# Patient Record
Sex: Female | Born: 1955 | Race: Black or African American | Hispanic: No | Marital: Married | Smoking: Never smoker
Health system: Southern US, Community
[De-identification: ages and names within clinical notes are randomized; demographics above are authoritative.]

## PROBLEM LIST (undated history)

## (undated) ENCOUNTER — Emergency Department (HOSPITAL_COMMUNITY): Admission: EM | Payer: Self-pay | Source: Home / Self Care

## (undated) DIAGNOSIS — M549 Dorsalgia, unspecified: Secondary | ICD-10-CM

## (undated) DIAGNOSIS — M1990 Unspecified osteoarthritis, unspecified site: Secondary | ICD-10-CM

## (undated) HISTORY — PX: ABDOMINAL HYSTERECTOMY: SHX81

## (undated) HISTORY — PX: TONSILLECTOMY: SUR1361

---

## 2003-07-08 ENCOUNTER — Emergency Department (HOSPITAL_COMMUNITY): Admission: EM | Admit: 2003-07-08 | Discharge: 2003-07-09 | Payer: Self-pay | Admitting: Emergency Medicine

## 2005-10-14 ENCOUNTER — Ambulatory Visit (HOSPITAL_COMMUNITY): Admission: RE | Admit: 2005-10-14 | Discharge: 2005-10-14 | Payer: Self-pay | Admitting: Obstetrics & Gynecology

## 2014-04-03 ENCOUNTER — Emergency Department (HOSPITAL_COMMUNITY)
Admission: EM | Admit: 2014-04-03 | Discharge: 2014-04-03 | Disposition: A | Discharge status: Home / Self Care | Payer: BC Managed Care – PPO | Attending: Emergency Medicine | Admitting: Emergency Medicine

## 2014-04-03 ENCOUNTER — Emergency Department (HOSPITAL_COMMUNITY): Payer: BC Managed Care – PPO

## 2014-04-03 ENCOUNTER — Encounter (HOSPITAL_COMMUNITY): Payer: Self-pay | Admitting: Emergency Medicine

## 2014-04-03 DIAGNOSIS — Y92410 Unspecified street and highway as the place of occurrence of the external cause: Secondary | ICD-10-CM | POA: Diagnosis not present

## 2014-04-03 DIAGNOSIS — S161XXA Strain of muscle, fascia and tendon at neck level, initial encounter: Secondary | ICD-10-CM

## 2014-04-03 DIAGNOSIS — E669 Obesity, unspecified: Secondary | ICD-10-CM | POA: Insufficient documentation

## 2014-04-03 DIAGNOSIS — S39012A Strain of muscle, fascia and tendon of lower back, initial encounter: Secondary | ICD-10-CM | POA: Diagnosis not present

## 2014-04-03 DIAGNOSIS — S0990XA Unspecified injury of head, initial encounter: Secondary | ICD-10-CM | POA: Insufficient documentation

## 2014-04-03 DIAGNOSIS — S199XXA Unspecified injury of neck, initial encounter: Secondary | ICD-10-CM | POA: Diagnosis present

## 2014-04-03 DIAGNOSIS — Y9389 Activity, other specified: Secondary | ICD-10-CM | POA: Diagnosis not present

## 2014-04-03 LAB — I-STAT CHEM 8, ED
BUN: 14 mg/dL (ref 6–23)
CALCIUM ION: 1.15 mmol/L (ref 1.13–1.30)
CHLORIDE: 109 meq/L (ref 96–112)
CREATININE: 0.8 mg/dL (ref 0.50–1.10)
GLUCOSE: 87 mg/dL (ref 70–99)
HEMATOCRIT: 42 % (ref 36.0–46.0)
HEMOGLOBIN: 14.3 g/dL (ref 12.0–15.0)
POTASSIUM: 3.6 meq/L — AB (ref 3.7–5.3)
Sodium: 143 mEq/L (ref 137–147)
TCO2: 22 mmol/L (ref 0–100)

## 2014-04-03 MED ORDER — ACETAMINOPHEN 500 MG PO TABS: 1000.0000 mg | ORAL_TABLET | Freq: Once | ORAL | Status: DC

## 2014-04-03 MED ORDER — SODIUM CHLORIDE 0.9 % IV BOLUS (SEPSIS)
1000.0000 mL | Freq: Once | INTRAVENOUS | Status: AC
  Administered 2014-04-03: 1000 mL via INTRAVENOUS

## 2014-04-03 MED ORDER — IOHEXOL 350 MG/ML SOLN
80.0000 mL | Freq: Once | INTRAVENOUS | Status: AC | PRN
  Administered 2014-04-03: 80 mL via INTRAVENOUS

## 2014-04-03 MED ORDER — MORPHINE SULFATE 4 MG/ML IJ SOLN
4.0000 mg | Freq: Once | INTRAMUSCULAR | Status: AC
  Administered 2014-04-03: 4 mg via INTRAVENOUS
  Filled 2014-04-03: qty 1

## 2014-04-03 MED ORDER — OXYCODONE-ACETAMINOPHEN 5-325 MG PO TABS: 1.0000 | ORAL_TABLET | Freq: Four times a day (QID) | ORAL | Status: DC | PRN
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
## 2014-04-03 NOTE — ED Notes (Addendum)
error 

## 2014-04-03 NOTE — ED Notes (Signed)
Pt refuses wheelchair, ambulatory with no apparent difficulty

## 2014-04-03 NOTE — ED Notes (Signed)
Patient returned from CT & X-ray, Monitor reapplied.
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
## 2014-04-03 NOTE — ED Notes (Signed)
Patient transported to X-ray 

## 2014-04-04 NOTE — Discharge Planning (Signed)
[facility] Community Health & Eligibility Specialist Spoke to patient regarding follow up care and establishing care with a provider. Resource guide and my contact information provided for any future questions or concerns. Pt states she has insurance at this time. No other Community Health & Eligibility Specialist needs identified at this time.

## 2014-09-25 ENCOUNTER — Other Ambulatory Visit: Payer: Self-pay | Admitting: Internal Medicine

## 2014-09-25 DIAGNOSIS — Z1231 Encounter for screening mammogram for malignant neoplasm of breast: Secondary | ICD-10-CM

## 2014-10-01 ENCOUNTER — Other Ambulatory Visit: Payer: Self-pay | Admitting: Internal Medicine

## 2014-10-01 ENCOUNTER — Ambulatory Visit
Admission: RE | Admit: 2014-10-01 | Discharge: 2014-10-01 | Disposition: A | Discharge status: Home / Self Care | Payer: 59 | Source: Ambulatory Visit | Attending: Internal Medicine | Admitting: Internal Medicine

## 2014-10-01 DIAGNOSIS — Z1231 Encounter for screening mammogram for malignant neoplasm of breast: Secondary | ICD-10-CM

## 2014-10-01 DIAGNOSIS — N6452 Nipple discharge: Secondary | ICD-10-CM

## 2014-10-07 ENCOUNTER — Other Ambulatory Visit: Payer: Self-pay | Admitting: Internal Medicine

## 2014-10-07 DIAGNOSIS — N6452 Nipple discharge: Secondary | ICD-10-CM

## 2014-10-23 ENCOUNTER — Ambulatory Visit
Admission: RE | Admit: 2014-10-23 | Discharge: 2014-10-23 | Disposition: A | Discharge status: Home / Self Care | Payer: 59 | Source: Ambulatory Visit | Attending: Internal Medicine | Admitting: Internal Medicine

## 2014-10-23 DIAGNOSIS — N6452 Nipple discharge: Secondary | ICD-10-CM

## 2014-11-20 ENCOUNTER — Other Ambulatory Visit: Payer: Self-pay | Admitting: Gastroenterology

## 2015-01-31 ENCOUNTER — Encounter (HOSPITAL_COMMUNITY): Payer: Self-pay | Admitting: *Deleted

## 2015-02-17 ENCOUNTER — Encounter (HOSPITAL_COMMUNITY): Payer: Self-pay

## 2015-02-17 ENCOUNTER — Ambulatory Visit (HOSPITAL_COMMUNITY): Payer: 59 | Admitting: Certified Registered Nurse Anesthetist

## 2015-02-17 ENCOUNTER — Encounter (HOSPITAL_COMMUNITY)
Admission: RE | Disposition: A | Discharge status: Home / Self Care | Payer: Self-pay | Source: Ambulatory Visit | Attending: Gastroenterology

## 2015-02-17 ENCOUNTER — Ambulatory Visit (HOSPITAL_COMMUNITY)
Admission: RE | Admit: 2015-02-17 | Discharge: 2015-02-17 | Disposition: A | Discharge status: Home / Self Care | Payer: 59 | Source: Ambulatory Visit | Attending: Gastroenterology | Admitting: Gastroenterology

## 2015-02-17 DIAGNOSIS — I517 Cardiomegaly: Secondary | ICD-10-CM | POA: Insufficient documentation

## 2015-02-17 DIAGNOSIS — K5730 Diverticulosis of large intestine without perforation or abscess without bleeding: Secondary | ICD-10-CM | POA: Diagnosis not present

## 2015-02-17 DIAGNOSIS — I10 Essential (primary) hypertension: Secondary | ICD-10-CM | POA: Diagnosis not present

## 2015-02-17 DIAGNOSIS — Z1211 Encounter for screening for malignant neoplasm of colon: Secondary | ICD-10-CM | POA: Diagnosis present

## 2015-02-17 HISTORY — PX: COLONOSCOPY WITH PROPOFOL: SHX5780

## 2015-02-17 HISTORY — DX: Dorsalgia, unspecified: M54.9

## 2015-02-17 HISTORY — DX: Unspecified osteoarthritis, unspecified site: M19.90

## 2015-02-17 SURGERY — COLONOSCOPY WITH PROPOFOL
Anesthesia: Monitor Anesthesia Care

## 2015-02-17 MED ORDER — PROPOFOL 10 MG/ML IV BOLUS
INTRAVENOUS | Status: AC
  Filled 2015-02-17: qty 20

## 2015-02-17 MED ORDER — SODIUM CHLORIDE 0.9 % IV SOLN: INTRAVENOUS | Status: DC

## 2015-02-17 MED ORDER — PROPOFOL 10 MG/ML IV BOLUS
INTRAVENOUS | Status: DC | PRN
  Administered 2015-02-17: 50 mg via INTRAVENOUS
  Administered 2015-02-17: 30 mg via INTRAVENOUS
  Administered 2015-02-17 (×3): 50 mg via INTRAVENOUS
  Administered 2015-02-17 (×2): 25 mg via INTRAVENOUS

## 2015-02-17 MED ORDER — LIDOCAINE HCL (CARDIAC) 20 MG/ML IV SOLN
INTRAVENOUS | Status: DC | PRN
  Administered 2015-02-17: 50 mg via INTRAVENOUS

## 2015-02-17 MED ORDER — LACTATED RINGERS IV SOLN
INTRAVENOUS | Status: DC
  Administered 2015-02-17: 1000 mL via INTRAVENOUS

## 2015-02-17 SURGICAL SUPPLY — 22 items

## 2015-02-17 NOTE — Discharge Instructions (Signed)

## 2015-02-17 NOTE — Anesthesia Preprocedure Evaluation (Addendum)
Anesthesia Evaluation  Patient identified by MRN, date of birth, ID band Patient awake    Reviewed: Allergy & Precautions, H&P , NPO status , Patient's Chart, lab work & pertinent test results  Airway Mallampati: II  TM Distance: >3 FB Neck ROM: full    Dental  (+) Dental Advisory Given, Edentulous Upper   Pulmonary neg pulmonary ROS,    Pulmonary exam normal breath sounds clear to auscultation       Cardiovascular Exercise Tolerance: Good negative cardio ROS Normal cardiovascular exam Rhythm:regular Rate:Normal     Neuro/Psych negative neurological ROS  negative psych ROS   GI/Hepatic negative GI ROS, Neg liver ROS,   Endo/Other  negative endocrine ROSMorbid obesity  Renal/GU negative Renal ROS  negative genitourinary   Musculoskeletal   Abdominal (+) + obese,   Peds  Hematology negative hematology ROS (+)   Anesthesia Other Findings   Reproductive/Obstetrics negative OB ROS                             Anesthesia Physical Anesthesia Plan  ASA: III  Anesthesia Plan: MAC   Post-op Pain Management:    Induction:   Airway Management Planned:   Additional Equipment:   Intra-op Plan:   Post-operative Plan:   Informed Consent: I have reviewed the patients History and Physical, chart, labs and discussed the procedure including the risks, benefits and alternatives for the proposed anesthesia with the patient or authorized representative who has indicated his/her understanding and acceptance.   Dental Advisory Given  Plan Discussed with: CRNA and Surgeon  Anesthesia Plan Comments:         Anesthesia Quick Evaluation

## 2015-02-17 NOTE — Transfer of Care (Signed)
Immediate Anesthesia Transfer of Care Note  Patient: Debbie Diaz  Procedure(s) Performed: Procedure(s): COLONOSCOPY WITH PROPOFOL (N/A)  Patient Location: PACU  Anesthesia Type:MAC  Level of Consciousness: awake, alert  and oriented  Airway & Oxygen Therapy: Patient Spontanous Breathing and Patient connected to face mask oxygen  Post-op Assessment: Report given to RN and Post -op Vital signs reviewed and stable  Post vital signs: Reviewed and stable  Last Vitals:  Filed Vitals:   02/17/15 0904  BP: 145/110  Pulse:   Temp:   Resp: 19    Complications: No apparent anesthesia complications

## 2015-02-17 NOTE — H&P (Signed)
  Procedure: Baseline screening colonoscopy. No family history of colon cancer.  History: The patient is a 59 year old female born 65/1957. She is scheduled to undergo her first screening colonoscopy with polypectomy to prevent colon cancer.  Medication allergies: None  Past medical history: Hypertension. Left ventricular hypertrophy. Vitamin D deficiency. Tonsillectomy. Cesarean sections performed in 1976 and 1981. Hysterectomy performed in 1983.  Exam: The patient is alert and lying comfortably on the endoscopy stretcher. Abdomen is soft and nontender to palpation. Lungs are clear to auscultation. Cardiac exam reveals a regular rhythm.  Plan: Proceed with baseline screening colonoscopy

## 2015-02-17 NOTE — Op Note (Signed)
Procedure: Baseline screening colonoscopy  Endoscopist: Danise Edge  Premedication: Propofol administered by anesthesia  Procedure: The patient was placed in the left lateral decubitus position. Anal inspection and digital rectal exam were normal. The Pentax pediatric colonoscope was introduced into the rectum and advanced to the cecum. A normal-appearing ileocecal valve and appendiceal orifice were identified. Colonic preparation for the exam today was good. Withdrawal time was 9 minutes  Rectum. Normal. Retroflex view of the distal rectum was normal  Sigmoid colon and descending colon. Left colonic diverticulosis  Splenic flexure. Normal  Transverse colon. Normal  Hepatic flexure. Normal  Ascending colon. Normal  Cecum and ileocecal valve. Normal  Assessment: Normal baseline screening colonoscopy  Recommendation: Schedule repeat screening colonoscopy in 10 years

## 2015-02-17 NOTE — Anesthesia Postprocedure Evaluation (Signed)
  Anesthesia Post-op Note  Patient: Debbie Diaz  Procedure(s) Performed: Procedure(s) (LRB): COLONOSCOPY WITH PROPOFOL (N/A)  Patient Location: PACU  Anesthesia Type: MAC  Level of Consciousness: awake and alert   Airway and Oxygen Therapy: Patient Spontanous Breathing  Post-op Pain: mild  Post-op Assessment: Post-op Vital signs reviewed, Patient's Cardiovascular Status Stable, Respiratory Function Stable, Patent Airway and No signs of Nausea or vomiting  Last Vitals:  Filed Vitals:   02/17/15 0904  BP: 145/110  Pulse:   Temp:   Resp: 19    Post-op Vital Signs: stable   Complications: No apparent anesthesia complications

## 2015-02-19 ENCOUNTER — Encounter (HOSPITAL_COMMUNITY): Payer: Self-pay | Admitting: Gastroenterology
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
## 2018-02-13 ENCOUNTER — Other Ambulatory Visit: Payer: Self-pay | Admitting: Internal Medicine

## 2018-02-13 ENCOUNTER — Ambulatory Visit
Admission: RE | Admit: 2018-02-13 | Discharge: 2018-02-13 | Disposition: A | Discharge status: Home / Self Care | Payer: BLUE CROSS/BLUE SHIELD | Source: Ambulatory Visit | Attending: Internal Medicine | Admitting: Internal Medicine

## 2018-02-13 DIAGNOSIS — Z1231 Encounter for screening mammogram for malignant neoplasm of breast: Secondary | ICD-10-CM

## 2018-02-13 DIAGNOSIS — M25561 Pain in right knee: Secondary | ICD-10-CM

## 2018-03-15 ENCOUNTER — Ambulatory Visit
Admission: RE | Admit: 2018-03-15 | Discharge: 2018-03-15 | Disposition: A | Discharge status: Home / Self Care | Payer: BLUE CROSS/BLUE SHIELD | Source: Ambulatory Visit | Attending: Internal Medicine | Admitting: Internal Medicine

## 2018-03-15 DIAGNOSIS — Z1231 Encounter for screening mammogram for malignant neoplasm of breast: Secondary | ICD-10-CM

## 2018-03-16 ENCOUNTER — Other Ambulatory Visit: Payer: Self-pay | Admitting: Internal Medicine

## 2018-03-16 DIAGNOSIS — R928 Other abnormal and inconclusive findings on diagnostic imaging of breast: Secondary | ICD-10-CM

## 2018-03-27 ENCOUNTER — Ambulatory Visit
Admission: RE | Admit: 2018-03-27 | Discharge: 2018-03-27 | Disposition: A | Discharge status: Home / Self Care | Payer: BLUE CROSS/BLUE SHIELD | Source: Ambulatory Visit | Attending: Internal Medicine | Admitting: Internal Medicine

## 2018-03-27 ENCOUNTER — Other Ambulatory Visit: Payer: Self-pay | Admitting: Internal Medicine

## 2018-03-27 DIAGNOSIS — N6320 Unspecified lump in the left breast, unspecified quadrant: Secondary | ICD-10-CM

## 2018-03-27 DIAGNOSIS — R928 Other abnormal and inconclusive findings on diagnostic imaging of breast: Secondary | ICD-10-CM

## 2018-03-27 HISTORY — PX: BREAST BIOPSY: SHX20

## 2019-10-05 DIAGNOSIS — H524 Presbyopia: Secondary | ICD-10-CM | POA: Diagnosis not present

## 2019-10-05 DIAGNOSIS — Z0100 Encounter for examination of eyes and vision without abnormal findings: Secondary | ICD-10-CM | POA: Diagnosis not present

## 2020-02-06 DIAGNOSIS — G5600 Carpal tunnel syndrome, unspecified upper limb: Secondary | ICD-10-CM | POA: Diagnosis not present

## 2020-02-06 DIAGNOSIS — M179 Osteoarthritis of knee, unspecified: Secondary | ICD-10-CM | POA: Diagnosis not present

## 2020-04-01 DIAGNOSIS — R197 Diarrhea, unspecified: Secondary | ICD-10-CM | POA: Diagnosis not present

## 2020-04-01 DIAGNOSIS — R509 Fever, unspecified: Secondary | ICD-10-CM | POA: Diagnosis not present

## 2020-04-01 DIAGNOSIS — R519 Headache, unspecified: Secondary | ICD-10-CM | POA: Diagnosis not present

## 2020-04-01 DIAGNOSIS — R52 Pain, unspecified: Secondary | ICD-10-CM | POA: Diagnosis not present

## 2020-04-02 DIAGNOSIS — R509 Fever, unspecified: Secondary | ICD-10-CM | POA: Diagnosis not present

## 2020-04-02 DIAGNOSIS — R519 Headache, unspecified: Secondary | ICD-10-CM | POA: Diagnosis not present

## 2020-04-02 DIAGNOSIS — Z03818 Encounter for observation for suspected exposure to other biological agents ruled out: Secondary | ICD-10-CM | POA: Diagnosis not present

## 2020-04-02 DIAGNOSIS — R52 Pain, unspecified: Secondary | ICD-10-CM | POA: Diagnosis not present

## 2020-04-02 DIAGNOSIS — R197 Diarrhea, unspecified: Secondary | ICD-10-CM | POA: Diagnosis not present

## 2020-05-13 DIAGNOSIS — Z1211 Encounter for screening for malignant neoplasm of colon: Secondary | ICD-10-CM | POA: Diagnosis not present

## 2020-05-13 DIAGNOSIS — Z0001 Encounter for general adult medical examination with abnormal findings: Secondary | ICD-10-CM | POA: Diagnosis not present

## 2020-05-13 DIAGNOSIS — I517 Cardiomegaly: Secondary | ICD-10-CM | POA: Diagnosis not present

## 2020-05-13 DIAGNOSIS — R319 Hematuria, unspecified: Secondary | ICD-10-CM | POA: Diagnosis not present

## 2020-05-13 DIAGNOSIS — E559 Vitamin D deficiency, unspecified: Secondary | ICD-10-CM | POA: Diagnosis not present

## 2020-05-13 DIAGNOSIS — I10 Essential (primary) hypertension: Secondary | ICD-10-CM | POA: Diagnosis not present

## 2020-05-13 DIAGNOSIS — Z79899 Other long term (current) drug therapy: Secondary | ICD-10-CM | POA: Diagnosis not present

## 2020-05-13 DIAGNOSIS — M179 Osteoarthritis of knee, unspecified: Secondary | ICD-10-CM | POA: Diagnosis not present

## 2020-06-17 DIAGNOSIS — Z03818 Encounter for observation for suspected exposure to other biological agents ruled out: Secondary | ICD-10-CM | POA: Diagnosis not present

## 2021-05-21 DIAGNOSIS — E559 Vitamin D deficiency, unspecified: Secondary | ICD-10-CM | POA: Diagnosis not present

## 2021-05-21 DIAGNOSIS — Z0001 Encounter for general adult medical examination with abnormal findings: Secondary | ICD-10-CM | POA: Diagnosis not present

## 2021-05-21 DIAGNOSIS — R319 Hematuria, unspecified: Secondary | ICD-10-CM | POA: Diagnosis not present

## 2021-05-21 DIAGNOSIS — I517 Cardiomegaly: Secondary | ICD-10-CM | POA: Diagnosis not present

## 2021-05-21 DIAGNOSIS — Z23 Encounter for immunization: Secondary | ICD-10-CM | POA: Diagnosis not present

## 2021-05-21 DIAGNOSIS — M179 Osteoarthritis of knee, unspecified: Secondary | ICD-10-CM | POA: Diagnosis not present

## 2021-05-21 DIAGNOSIS — Z79899 Other long term (current) drug therapy: Secondary | ICD-10-CM | POA: Diagnosis not present

## 2021-05-21 DIAGNOSIS — I10 Essential (primary) hypertension: Secondary | ICD-10-CM | POA: Diagnosis not present

## 2022-02-23 DIAGNOSIS — Z03818 Encounter for observation for suspected exposure to other biological agents ruled out: Secondary | ICD-10-CM | POA: Diagnosis not present

## 2022-07-15 ENCOUNTER — Other Ambulatory Visit: Payer: Self-pay | Admitting: Internal Medicine

## 2022-07-15 DIAGNOSIS — Z6841 Body Mass Index (BMI) 40.0 and over, adult: Secondary | ICD-10-CM | POA: Diagnosis not present

## 2022-07-15 DIAGNOSIS — Z122 Encounter for screening for malignant neoplasm of respiratory organs: Secondary | ICD-10-CM

## 2022-07-15 DIAGNOSIS — Z780 Asymptomatic menopausal state: Secondary | ICD-10-CM

## 2022-07-15 DIAGNOSIS — E7800 Pure hypercholesterolemia, unspecified: Secondary | ICD-10-CM | POA: Diagnosis not present

## 2022-07-15 DIAGNOSIS — G5603 Carpal tunnel syndrome, bilateral upper limbs: Secondary | ICD-10-CM | POA: Diagnosis not present

## 2022-07-15 DIAGNOSIS — I10 Essential (primary) hypertension: Secondary | ICD-10-CM | POA: Diagnosis not present

## 2022-07-15 DIAGNOSIS — Z720 Tobacco use: Secondary | ICD-10-CM | POA: Diagnosis not present

## 2022-07-15 DIAGNOSIS — E559 Vitamin D deficiency, unspecified: Secondary | ICD-10-CM | POA: Diagnosis not present

## 2022-07-15 DIAGNOSIS — Z1231 Encounter for screening mammogram for malignant neoplasm of breast: Secondary | ICD-10-CM

## 2022-07-15 DIAGNOSIS — Z0000 Encounter for general adult medical examination without abnormal findings: Secondary | ICD-10-CM | POA: Diagnosis not present

## 2022-08-27 ENCOUNTER — Other Ambulatory Visit (HOSPITAL_COMMUNITY): Payer: Self-pay | Admitting: Internal Medicine

## 2022-08-27 DIAGNOSIS — I10 Essential (primary) hypertension: Secondary | ICD-10-CM | POA: Diagnosis not present

## 2022-08-27 DIAGNOSIS — E7800 Pure hypercholesterolemia, unspecified: Secondary | ICD-10-CM | POA: Diagnosis not present

## 2022-08-27 DIAGNOSIS — M170 Bilateral primary osteoarthritis of knee: Secondary | ICD-10-CM | POA: Diagnosis not present

## 2022-08-27 DIAGNOSIS — Z6841 Body Mass Index (BMI) 40.0 and over, adult: Secondary | ICD-10-CM | POA: Diagnosis not present

## 2022-09-15 ENCOUNTER — Other Ambulatory Visit: Payer: Self-pay | Admitting: Internal Medicine

## 2022-09-15 ENCOUNTER — Ambulatory Visit
Admission: RE | Admit: 2022-09-15 | Discharge: 2022-09-15 | Disposition: A | Discharge status: Home / Self Care | Payer: Medicare HMO | Source: Ambulatory Visit | Attending: Internal Medicine | Admitting: Internal Medicine

## 2022-09-15 DIAGNOSIS — N6320 Unspecified lump in the left breast, unspecified quadrant: Secondary | ICD-10-CM

## 2022-09-15 DIAGNOSIS — R928 Other abnormal and inconclusive findings on diagnostic imaging of breast: Secondary | ICD-10-CM

## 2022-09-15 DIAGNOSIS — Z122 Encounter for screening for malignant neoplasm of respiratory organs: Secondary | ICD-10-CM

## 2022-09-16 ENCOUNTER — Inpatient Hospital Stay: Admission: RE | Admit: 2022-09-16 | Payer: BLUE CROSS/BLUE SHIELD | Source: Ambulatory Visit

## 2022-09-16 ENCOUNTER — Ambulatory Visit
Admission: RE | Admit: 2022-09-16 | Discharge: 2022-09-16 | Disposition: A | Discharge status: Home / Self Care | Payer: Medicare HMO | Source: Ambulatory Visit | Attending: Internal Medicine | Admitting: Internal Medicine

## 2022-09-16 ENCOUNTER — Encounter (HOSPITAL_COMMUNITY): Payer: Self-pay

## 2022-09-16 ENCOUNTER — Ambulatory Visit (HOSPITAL_COMMUNITY): Payer: BLUE CROSS/BLUE SHIELD

## 2022-09-16 DIAGNOSIS — R928 Other abnormal and inconclusive findings on diagnostic imaging of breast: Secondary | ICD-10-CM

## 2022-09-16 DIAGNOSIS — N6320 Unspecified lump in the left breast, unspecified quadrant: Secondary | ICD-10-CM

## 2022-09-16 DIAGNOSIS — N6452 Nipple discharge: Secondary | ICD-10-CM | POA: Diagnosis not present

## 2023-01-13 ENCOUNTER — Other Ambulatory Visit: Payer: BLUE CROSS/BLUE SHIELD

## 2023-01-18 ENCOUNTER — Other Ambulatory Visit: Payer: Self-pay | Admitting: Internal Medicine

## 2023-01-18 DIAGNOSIS — Z780 Asymptomatic menopausal state: Secondary | ICD-10-CM

## 2023-03-10 DIAGNOSIS — F17210 Nicotine dependence, cigarettes, uncomplicated: Secondary | ICD-10-CM | POA: Diagnosis not present

## 2023-03-10 DIAGNOSIS — E785 Hyperlipidemia, unspecified: Secondary | ICD-10-CM | POA: Diagnosis not present

## 2023-03-10 DIAGNOSIS — F1290 Cannabis use, unspecified, uncomplicated: Secondary | ICD-10-CM | POA: Diagnosis not present

## 2023-03-10 DIAGNOSIS — I10 Essential (primary) hypertension: Secondary | ICD-10-CM | POA: Diagnosis not present

## 2023-03-10 DIAGNOSIS — R109 Unspecified abdominal pain: Secondary | ICD-10-CM | POA: Diagnosis not present

## 2023-03-10 DIAGNOSIS — E559 Vitamin D deficiency, unspecified: Secondary | ICD-10-CM | POA: Diagnosis not present

## 2023-03-10 DIAGNOSIS — Z79899 Other long term (current) drug therapy: Secondary | ICD-10-CM | POA: Diagnosis not present

## 2023-03-10 DIAGNOSIS — E7800 Pure hypercholesterolemia, unspecified: Secondary | ICD-10-CM | POA: Diagnosis not present

## 2023-03-24 DIAGNOSIS — Z0001 Encounter for general adult medical examination with abnormal findings: Secondary | ICD-10-CM | POA: Diagnosis not present

## 2023-03-24 DIAGNOSIS — E785 Hyperlipidemia, unspecified: Secondary | ICD-10-CM | POA: Diagnosis not present

## 2023-03-24 DIAGNOSIS — Z79899 Other long term (current) drug therapy: Secondary | ICD-10-CM | POA: Diagnosis not present

## 2023-03-24 DIAGNOSIS — I739 Peripheral vascular disease, unspecified: Secondary | ICD-10-CM | POA: Diagnosis not present

## 2023-03-24 DIAGNOSIS — R7303 Prediabetes: Secondary | ICD-10-CM | POA: Diagnosis not present

## 2023-07-21 ENCOUNTER — Other Ambulatory Visit: Payer: Medicare HMO

## 2023-08-15 ENCOUNTER — Ambulatory Visit (HOSPITAL_COMMUNITY): Admission: EM | Admit: 2023-08-15 | Discharge: 2023-08-15 | Disposition: A | Discharge status: Home / Self Care

## 2023-08-15 ENCOUNTER — Telehealth (HOSPITAL_COMMUNITY): Payer: Self-pay

## 2023-08-15 ENCOUNTER — Ambulatory Visit (HOSPITAL_COMMUNITY)
Admission: RE | Admit: 2023-08-15 | Discharge: 2023-08-15 | Disposition: A | Discharge status: Home / Self Care | Source: Ambulatory Visit | Attending: Family Medicine | Admitting: Family Medicine

## 2023-08-15 ENCOUNTER — Encounter (HOSPITAL_COMMUNITY): Payer: Self-pay

## 2023-08-15 DIAGNOSIS — M7989 Other specified soft tissue disorders: Secondary | ICD-10-CM | POA: Diagnosis not present

## 2023-08-15 DIAGNOSIS — M25561 Pain in right knee: Secondary | ICD-10-CM | POA: Insufficient documentation

## 2023-08-15 DIAGNOSIS — M79661 Pain in right lower leg: Secondary | ICD-10-CM | POA: Diagnosis not present

## 2023-08-15 DIAGNOSIS — M25861 Other specified joint disorders, right knee: Secondary | ICD-10-CM

## 2023-08-15 MED ORDER — MELOXICAM 7.5 MG PO TABS: 7.5000 mg | ORAL_TABLET | Freq: Every day | ORAL | 0 refills | Status: AC

## 2023-08-15 MED ORDER — SULFAMETHOXAZOLE-TRIMETHOPRIM 800-160 MG PO TABS: 1.0000 | ORAL_TABLET | Freq: Two times a day (BID) | ORAL | 0 refills | Status: AC

## 2023-08-15 NOTE — Discharge Instructions (Addendum)
 Discussed with patient and family we need to send over an ultrasound to rule out DVT Family will take patient over for exam If symptoms become worse patient will need to be seen in the emergency room Follow-up with the large Ortho tomorrow Breast elevate and ice the knee several times a day Pain medicine as needed

## 2023-08-15 NOTE — ED Provider Notes (Signed)
 [facility]    CSN: [id number] Arrival date & time: 08/15/23  1208      History   Chief Complaint Chief Complaint  Patient presents with   Knee Pain    HPI Debbie Diaz is a 68 y.o. female.   Patient presents today with family members for right calf pain and swelling to right foot.  Denies any injury.  States the swelling has been increasing since Sunday.  Patient very limited movement yelling with any type of rotation or palpation posterior knee.  Positive Homans.  Denies any shortness of breath or chest pain at this time.  Took Tylenol prior to arrival with no relief    Past Medical History:  Diagnosis Date   Arthritis    evrerywhere   Back pain    occasional    There are no active problems to display for this patient.   Past Surgical History:  Procedure Laterality Date   ABDOMINAL HYSTERECTOMY     partial   BREAST BIOPSY Left 03/27/2018   CESAREAN SECTION     COLONOSCOPY WITH PROPOFOL N/A 02/17/2015   Procedure: COLONOSCOPY WITH PROPOFOL;  Surgeon: Charolett Bumpers, MD;  Location: [facility];  Service: Endoscopy;  Laterality: N/A;   TONSILLECTOMY      OB History   No obstetric history on file.      Home Medications    Prior to Admission medications   Medication Sig Start Date End Date Taking? Authorizing Provider  atorvastatin (LIPITOR) 40 MG tablet Take 40 mg by mouth daily. 06/19/23  Yes [provider]  meloxicam (MOBIC) 7.5 MG tablet Take 1 tablet (7.5 mg total) by mouth daily. 08/15/23  Yes Coralyn Mark, NP  methimazole (TAPAZOLE) 5 MG tablet Take 5 mg by mouth every other day. 06/02/23  Yes [provider]  sulfamethoxazole-trimethoprim (BACTRIM DS) 800-160 MG tablet Take 1 tablet by mouth 2 (two) times daily for 7 days. 08/15/23 08/22/23 Yes Coralyn Mark, NP  acetaminophen (TYLENOL) 325 MG tablet Take 325 mg by mouth every 6 (six) hours as needed for mild pain (pain score 1-3).    [provider]   Cholecalciferol (VITAMIN D PO) Take 1 tablet by mouth every morning.    [provider]  ketotifen (ZADITOR) 0.025 % ophthalmic solution Place 1-2 drops into both eyes daily as needed (allergies.).    [provider]  losartan (COZAAR) 50 MG tablet Take 50 mg by mouth 2 (two) times daily.    [provider]  naproxen sodium (ANAPROX) 220 MG tablet Take 440 mg by mouth 2 (two) times daily as needed (pain.).    [provider]  sodium chloride (OCEAN) 0.65 % SOLN nasal spray Place 1-2 sprays into both nostrils daily as needed for congestion.    [provider]    Family History History reviewed. No pertinent family history.  Social History Social History   Tobacco Use   Smoking status: Every Day    Types: Cigarettes   Smokeless tobacco: Never  Vaping Use   Vaping status: Never Used  Substance Use Topics   Alcohol use: No   Drug use: Yes    Types: Marijuana    Comment: occasional     Allergies   Demerol [meperidine]   Review of Systems Review of Systems  Constitutional:  Positive for activity change. Negative for fever.       Not mobile not able to bear weight on right leg  Respiratory: Negative.  Negative for  cough and shortness of breath.   Cardiovascular: Negative.  Negative for chest pain.  Musculoskeletal:        Moderate amount of edema warm to touch right calf with slight edema noted to right foot  Skin:        Slight erythema noted to right calf posterior knee  Neurological: Negative.      Physical Exam Triage Vital Signs ED Triage Vitals  Encounter Vitals Group     BP 08/15/23 1412 (!) 146/88     Systolic BP Percentile --      Diastolic BP Percentile --      Pulse Rate 08/15/23 1412 94     Resp 08/15/23 1412 16     Temp 08/15/23 1412 99.4 F (37.4 C)     Temp Source 08/15/23 1412 Oral     SpO2 08/15/23 1412 97 %     Weight 08/15/23 1412 243 lb (110.2 kg)     Height 08/15/23 1412 5\' 5"  (1.651 m)     Head  Circumference --      Peak Flow --      Pain Score 08/15/23 1411 10     Pain Loc --      Pain Education --      Exclude from Growth Chart --    No data found.  Updated Vital Signs BP (!) 146/88 (BP Location: Right Arm)   Pulse 94   Temp 99.4 F (37.4 C) (Oral)   Resp 16   Ht 5\' 5"  (1.651 m)   Wt 243 lb (110.2 kg)   SpO2 97%   BMI 40.44 kg/m   Visual Acuity Right Eye Distance:   Left Eye Distance:   Bilateral Distance:    Right Eye Near:   Left Eye Near:    Bilateral Near:     Physical Exam Constitutional:      Appearance: Normal appearance.     Comments: Patient anxious.  Yelling with palpation to right calf during assessment very limited range of motion due to pain  Cardiovascular:     Rate and Rhythm: Normal rate.  Pulmonary:     Effort: Pulmonary effort is normal.  Musculoskeletal:        General: Swelling and tenderness present. No signs of injury.     Right lower leg: Edema present.     Comments: Decreased range of motion to right lower extremity +1 nonpitting edema noted to foot moderate amount of edema noted to right calf with slight erythema posterior.  Positive Homans  Skin:    Capillary Refill: Capillary refill takes 2 to 3 seconds. Right lower extremity Neurological:     Mental Status: She is alert.      UC Treatments / Results  Labs (all labs ordered are listed, but only abnormal results are displayed) Labs Reviewed - No data to display  EKG   Radiology LE VENOUS Result Date: 08/15/2023  Lower Venous DVT Study Patient Name:  [name]  Date of Exam:   08/15/2023 Medical Rec #: [id number]   Accession #:    [id number] Date of Birth: 1956-03-12   Patient Gender: F Patient Age:   82 years Exam Location:  [facility] Procedure:      VAS [place] LOWER EXTREMITY VENOUS (DVT) Referring Phys: Shawna Orleans Zakery Normington --------------------------------------------------------------------------------  Indications: Pain knee, posterior thigh and calf..  Comparison  Study: No prior study on file Performing Technologist: Sherren Kerns RVS  Examination Guidelines: A complete evaluation includes B-mode imaging, spectral Doppler, color Doppler,  and power Doppler as needed of all accessible portions of each vessel. Bilateral testing is considered an integral part of a complete examination. Limited examinations for reoccurring indications may be performed as noted. The reflux portion of the exam is performed with the patient in reverse Trendelenburg.  +---------+---------------+---------+-----------+----------+-------------------+ RIGHT    CompressibilityPhasicitySpontaneityPropertiesThrombus Aging      +---------+---------------+---------+-----------+----------+-------------------+ CFV      Full           Yes      Yes                                      +---------+---------------+---------+-----------+----------+-------------------+ SFJ      Full                                                             +---------+---------------+---------+-----------+----------+-------------------+ FV Prox  Full                                                             +---------+---------------+---------+-----------+----------+-------------------+ FV Mid   Full                                                             +---------+---------------+---------+-----------+----------+-------------------+ FV Distal               Yes      Yes                  patent by color and                                                       Doppler             +---------+---------------+---------+-----------+----------+-------------------+ PFV      Full                                                             +---------+---------------+---------+-----------+----------+-------------------+ POP      Full           Yes      Yes                                      +---------+---------------+---------+-----------+----------+-------------------+  PTV      Full                                                             +---------+---------------+---------+-----------+----------+-------------------+  PERO     Full                                                             +---------+---------------+---------+-----------+----------+-------------------+   Left Technical Findings: Left leg not evaluated.   Summary: RIGHT: - There is no evidence of deep vein thrombosis in the lower extremity.  - Fluid filled structure noted posterior distal thigh in to popliteal fossa and medial knee. Etiology unknown.   *See table(s) above for measurements and observations.    Preliminary     Procedures Procedures (including critical care time)  Medications Ordered in UC Medications - No data to display  Initial Impression / Assessment and Plan / UC Course  I have reviewed the triage vital signs and the nursing notes.  Pertinent labs & imaging results that were available during my care of the patient were reviewed by me and considered in my medical decision making (see chart for details).     Discussed with patient and family member patient will need to have an outpatient ultrasound completed Rule out DVT Family states that they can take patient to this appointment Results will be called to provider if negative results patient will need to follow-up with orthopedic in the next 24 hours Ultrasound completed call with results states is more cystic fluid versus DVT We will call in some pain medicine patient will need to follow-up with a orthopedic.  Patient will need to call to schedule an appointment follow-up Rest elevate ice the area We will cover with an antibiotic due to risk of possible infection Take pain medicine as needed  Final Clinical Impressions(s) / UC Diagnoses   Final diagnoses:  Right calf pain  Swelling of calf  Swelling of right foot  Knee joint cyst, right     Discharge Instructions      Discussed with patient  and family we need to send over an ultrasound to rule out DVT Family will take patient over for exam If symptoms become worse patient will need to be seen in the emergency room Follow-up with the large Ortho tomorrow Breast elevate and ice the knee several times a day Pain medicine as needed      ED Prescriptions     Medication Sig Dispense Auth. Provider   meloxicam (MOBIC) 7.5 MG tablet Take 1 tablet (7.5 mg total) by mouth daily. 15 tablet Diaz Mirza L, NP   sulfamethoxazole-trimethoprim (BACTRIM DS) 800-160 MG tablet Take 1 tablet by mouth 2 (two) times daily for 7 days. 14 tablet Coralyn Mark, NP      PDMP not reviewed this encounter.   Coralyn Mark, NP 08/15/23 [phone]

## 2023-08-15 NOTE — Telephone Encounter (Signed)
 [facility] called and stated that the patient was negative for DVT. There is a collection of Fluid in the distal thigh going into the popliteal fossa. It is a cystic structure. Starts proximal to the popliteal fossa.

## 2023-08-15 NOTE — Progress Notes (Signed)
 VASCULAR LAB    Right lower extremity venous duplex has been performed.   See CV proc for preliminary results.  Called report to [facility], Violet Cart, RVT 08/15/2023, 3:56 PM

## 2023-08-15 NOTE — ED Triage Notes (Signed)
 Patient here today with c/o right knee pain since Sunday. Patient states that she was at a funeral on Saturday and noticed some discomfort then. No known injury. She tried taking Tylenol PM with no relief.

## 2023-08-24 ENCOUNTER — Emergency Department (HOSPITAL_BASED_OUTPATIENT_CLINIC_OR_DEPARTMENT_OTHER)

## 2023-08-24 ENCOUNTER — Other Ambulatory Visit: Payer: Self-pay

## 2023-08-24 ENCOUNTER — Emergency Department (HOSPITAL_BASED_OUTPATIENT_CLINIC_OR_DEPARTMENT_OTHER)
Admission: EM | Admit: 2023-08-24 | Discharge: 2023-08-24 | Disposition: A | Discharge status: Home / Self Care | Attending: Student | Admitting: Student

## 2023-08-24 DIAGNOSIS — M25561 Pain in right knee: Secondary | ICD-10-CM | POA: Insufficient documentation

## 2023-08-24 DIAGNOSIS — M25461 Effusion, right knee: Secondary | ICD-10-CM | POA: Insufficient documentation

## 2023-08-24 LAB — SYNOVIAL CELL COUNT + DIFF, W/ CRYSTALS
Crystals, Fluid: NONE SEEN
Eosinophils-Synovial: 0 % (ref 0–1)
Lymphocytes-Synovial Fld: 8 % (ref 0–20)
Monocyte-Macrophage-Synovial Fluid: 7 % — ABNORMAL LOW (ref 50–90)
Neutrophil, Synovial: 85 % — ABNORMAL HIGH (ref 0–25)
WBC, Synovial: 81000 /mm3 — ABNORMAL HIGH (ref 0–200)

## 2023-08-24 LAB — CBC WITH DIFFERENTIAL/PLATELET
Abs Immature Granulocytes: 0.09 10*3/uL — ABNORMAL HIGH (ref 0.00–0.07)
Basophils Absolute: 0 10*3/uL (ref 0.0–0.1)
Basophils Relative: 0 %
Eosinophils Absolute: 0.2 10*3/uL (ref 0.0–0.5)
Eosinophils Relative: 2 %
HCT: 31.5 % — ABNORMAL LOW (ref 36.0–46.0)
Hemoglobin: 10.3 g/dL — ABNORMAL LOW (ref 12.0–15.0)
Immature Granulocytes: 1 %
Lymphocytes Relative: 13 %
Lymphs Abs: 1.3 10*3/uL (ref 0.7–4.0)
MCH: 27.4 pg (ref 26.0–34.0)
MCHC: 32.7 g/dL (ref 30.0–36.0)
MCV: 83.8 fL (ref 80.0–100.0)
Monocytes Absolute: 0.8 10*3/uL (ref 0.1–1.0)
Monocytes Relative: 7 %
Neutro Abs: 8 10*3/uL — ABNORMAL HIGH (ref 1.7–7.7)
Neutrophils Relative %: 77 %
Platelets: 512 10*3/uL — ABNORMAL HIGH (ref 150–400)
RBC: 3.76 MIL/uL — ABNORMAL LOW (ref 3.87–5.11)
RDW: 15.6 % — ABNORMAL HIGH (ref 11.5–15.5)
WBC: 10.4 10*3/uL (ref 4.0–10.5)
nRBC: 0 % (ref 0.0–0.2)

## 2023-08-24 LAB — BASIC METABOLIC PANEL
Anion gap: 9 (ref 5–15)
BUN: 15 mg/dL (ref 8–23)
CO2: 22 mmol/L (ref 22–32)
Calcium: 9.2 mg/dL (ref 8.9–10.3)
Chloride: 106 mmol/L (ref 98–111)
Creatinine, Ser: 0.81 mg/dL (ref 0.44–1.00)
GFR, Estimated: >60 mL/min (ref >60)
Glucose, Bld: 90 mg/dL (ref 70–99)
Potassium: 4 mmol/L (ref 3.5–5.1)
Sodium: 137 mmol/L (ref 135–145)

## 2023-08-24 MED ORDER — IOHEXOL 300 MG/ML  SOLN
100.0000 mL | Freq: Once | INTRAMUSCULAR | Status: AC | PRN
  Administered 2023-08-24: 100 mL via INTRAVENOUS

## 2023-08-24 MED ORDER — HYDROCODONE-ACETAMINOPHEN 5-325 MG PO TABS: 1.0000 | ORAL_TABLET | Freq: Four times a day (QID) | ORAL | 0 refills | Status: DC | PRN

## 2023-08-24 MED ORDER — LIDOCAINE-EPINEPHRINE 2 %-1:100000 IJ SOLN
10.0000 mL | Freq: Once | INTRAMUSCULAR | Status: AC
  Administered 2023-08-24: 10 mL
  Filled 2023-08-24: qty 10.2

## 2023-08-24 MED ORDER — HYDROCODONE-ACETAMINOPHEN 5-325 MG PO TABS
1.0000 | ORAL_TABLET | Freq: Once | ORAL | Status: AC | PRN
  Administered 2023-08-24: 1 via ORAL
  Filled 2023-08-24: qty 1

## 2023-08-24 MED ORDER — HYDROCODONE-ACETAMINOPHEN 5-325 MG PO TABS
1.0000 | ORAL_TABLET | Freq: Once | ORAL | Status: AC
  Administered 2023-08-24: 1 via ORAL
  Filled 2023-08-24: qty 1

## 2023-08-24 MED ORDER — HYDROCODONE-ACETAMINOPHEN 5-325 MG PO TABS: 1.0000 | ORAL_TABLET | Freq: Four times a day (QID) | ORAL | 0 refills | Status: AC | PRN

## 2023-08-24 NOTE — ED Triage Notes (Signed)
 Brought in by EMS from home for eval of right knee and leg pain. Was told she had a cyst behind right knee and a tear in the calf muscle.

## 2023-08-24 NOTE — Discharge Instructions (Addendum)
 You have been seen today for your complaint of right knee pain. Your lab work was reassuring. Your imaging showed swelling of the knee and a Baker's cyst.  The fluid drained from your knee did show inflammation, but we consulted the orthopedist, and were all in agreement that it does not appear to be infected.  Please follow-up with the orthopedic clinic for further fluid drainage for symptom relief.  You should receive a call from the hospital if the culture results from the fluid are positive for a bacterial infection.  Your discharge medications include Vicodin. This is an opioid pain medication. You should only take this medication as needed for severe pain. You should not drive, operate heavy machinery or make important decisions while taking this medication. You should use alternative methods for pain relief while taking this medication including stretching, gentle range of motion, and alternating tylenol and ibuprofen.  You may only take up to 4000 mg of Tylenol total per day.  Each Vicodin contains 325 mg of Tylenol Follow up with: Dr. Ave Filter with orthopedics Please seek immediate medical care if you develop any of the following symptoms: Your leg turns pale, cool, or blue. At this time there does not appear to be the presence of an emergent medical condition, however there is always the potential for conditions to change. Please read and follow the below instructions.  Do not take your medicine if  develop an itchy rash, swelling in your mouth or lips, or difficulty breathing; call 911 and seek immediate emergency medical attention if this occurs.  You may review your lab tests and imaging results in their entirety on your MyChart account.  Please discuss all results of fully with your primary care provider and other specialist at your follow-up visit.  Note: Portions of this text may have been transcribed using voice recognition software. Every effort was made to ensure accuracy; however,  inadvertent computerized transcription errors may still be present.

## 2023-08-24 NOTE — ED Provider Notes (Cosign Needed)
 Accepted handoff at shift change from [facility]. Please see prior provider note for full HPI.  Briefly: Patient is a 68 y.o. female who presents to the ER for R knee swelling.   DDX/Plan: Arthrocentesis performed and synovial fluid sent for testing. Pending synovial cell count + gram stain.   Physical Exam  BP 125/78 (BP Location: Right Arm)   Pulse 87   Temp 98.3 F (36.8 C) (Oral)   Resp 17   Ht 5\' 5"  (1.651 m)   Wt 110.2 kg   SpO2 98%   BMI 40.44 kg/m   Physical Exam Vitals and nursing note reviewed.  Constitutional:      Appearance: Normal appearance.  HENT:     Head: Normocephalic and atraumatic.  Eyes:     Conjunctiva/sclera: Conjunctivae normal.  Pulmonary:     Effort: Pulmonary effort is normal. No respiratory distress.  Musculoskeletal:     Comments: Right knee swollen, but no overlying erythema or increased warmth  Skin:    General: Skin is warm and dry.  Neurological:     Mental Status: She is alert.  Psychiatric:        Mood and Affect: Mood normal.        Behavior: Behavior normal.    Results   Body fluid culture w Gram Stain   Collection Time: 08/24/23  4:26 PM   Specimen: Synovium; Body Fluid  Result Value Ref Range   Specimen Description SYNOVIAL    Special Requests RIGHT KNEE    Gram Stain      ABUNDANT WBC PRESENT, PREDOMINANTLY PMN NO ORGANISMS SEEN Performed at [facility], [address] [place], [place] [address]    Culture PENDING    Report Status PENDING   Synovial cell count + diff, w/ crystals   Collection Time: 08/24/23  4:26 PM  Result Value Ref Range   Color, Synovial PINK (A) YELLOW   Appearance-Synovial TURBID (A) CLEAR   Crystals, Fluid NO CRYSTALS SEEN    WBC, Synovial 81,000 (H) 0 - 200 /cu mm   Neutrophil, Synovial 85 (H) 0 - 25 %   Lymphocytes-Synovial Fld 8 0 - 20 %   Monocyte-Macrophage-Synovial Fluid 7 (L) 50 - 90 %   Eosinophils-Synovial 0 0 - 1 %   ED Course / MDM   Clinical Course as of 08/24/23  2109  Wed Aug 24, 2023  1527 Spoke with orthopedics, Earney Hamburg PA-C.  He recommends considering arthrocentesis for symptomatic relief, still need to follow-up with orthopedics [AS]  1720 Lab believes cell count will be resulted around 8 PM [AS]    Clinical Course User Index [AS] Schutt, Edsel Petrin, PA-C   Medical Decision Making Amount and/or Complexity of Data Reviewed Labs: ordered. Radiology: ordered.  Risk Prescription drug management.  2100 -- Synovial cell count with 81,000 WBCs, 85% neutrophils, no crystals. Gram stain with no organisms. Culture pending.   2130 -- Consulted with Dr. Ave Filter with [facility].  He reviewed the patient's imaging as well as synovial results.  Overall we agree that the clinical picture is not likely for septic arthritis.  Patient has no clinical findings of this, no fever, tachycardia, she has a normal WBC count.  He feels that patient could probably have more fluid drained, as the effusion on her CT scan is fairly remarkable.  We discussed that patient could either have more fluid drained in the ER, for symptomatic relief, or she could follow-up with the orthopedic clinic and have that  done there.  2135 -- I discussed the consultation with the patient.  She would prefer to follow-up with orthopedics outpatient for further drainage, and I think this is reasonable.  Pain medication was sent by previous provider to the pharmacy. After consideration of the diagnostic results and the patients response to treatment, I feel that emergency department workup does not suggest an emergent condition requiring admission or immediate intervention beyond what has been performed at this time. The patient is safe for discharge and has been instructed to return immediately for worsening symptoms, change in symptoms or any other concerns.  I discussed this case with my attending physician Dr. Freida Busman who cosigned this note including patient's presenting  symptoms, physical exam, and planned diagnostics and interventions. Attending physician stated agreement with plan or made changes to plan which were implemented.    [place] Shor T, PA-C 08/24/23 2140

## 2023-08-24 NOTE — ED Notes (Signed)
 Discharge instructions reviewed.   Newly prescribed medications discussed. Pharmacy verified.   Opportunity for questions and concerns provided.   Alert, oriented and escorted to vehicle via wheelchair.   Displays no signs of distress.

## 2023-08-24 NOTE — ED Provider Notes (Signed)
 [place] EMERGENCY DEPARTMENT AT [facility] Provider Note   CSN: [id number] Arrival date & time: 08/24/23  0957     History  Chief Complaint  Patient presents with   Knee Pain    Debbie Diaz is a 67 y.o. female.  With history of arthritis presenting to the ED for evaluation of right knee pain.  Symptoms began approximately 2 weeks ago.  She went to urgent care 9 days ago and had an outpatient DVT study which showed a cystic-like structure in the posterior of the right knee.  Symptoms have improved, however still very painful and she has been unable to ambulate.  She typically works on her feet for 10 hours a day.  She has been unable to ambulate for the past couple of days.  She states that she was just told to follow-up with orthopedics yesterday but has had difficulty leaving the house due to the pain.  She denies any significant trauma to the lower extremity.  Pain is localized to the knee.  No fevers.  She has been taking meloxicam for her pain with minimal improvement.   Knee Pain      Home Medications Prior to Admission medications   Medication Sig Start Date End Date Taking? Authorizing Provider  HYDROcodone-acetaminophen (NORCO/VICODIN) 5-325 MG tablet Take 1 tablet by mouth every 6 (six) hours as needed. 08/24/23  Yes Engelbert Sevin, Edsel Petrin, PA-C  acetaminophen (TYLENOL) 325 MG tablet Take 325 mg by mouth every 6 (six) hours as needed for mild pain (pain score 1-3).    [provider]  atorvastatin (LIPITOR) 40 MG tablet Take 40 mg by mouth daily. 06/19/23   [provider]  Cholecalciferol (VITAMIN D PO) Take 1 tablet by mouth every morning.    [provider]  ketotifen (ZADITOR) 0.025 % ophthalmic solution Place 1-2 drops into both eyes daily as needed (allergies.).    [provider]  losartan (COZAAR) 50 MG tablet Take 50 mg by mouth 2 (two) times daily.    [provider]  meloxicam (MOBIC) 7.5 MG tablet Take 1 tablet  (7.5 mg total) by mouth daily. 08/15/23   Coralyn Mark, NP  methimazole (TAPAZOLE) 5 MG tablet Take 5 mg by mouth every other day. 06/02/23   [provider]  naproxen sodium (ANAPROX) 220 MG tablet Take 440 mg by mouth 2 (two) times daily as needed (pain.).    [provider]  sodium chloride (OCEAN) 0.65 % SOLN nasal spray Place 1-2 sprays into both nostrils daily as needed for congestion.    [provider]      Allergies    Demerol [meperidine]    Review of Systems   Review of Systems  Musculoskeletal:  Positive for arthralgias.  All other systems reviewed and are negative.   Physical Exam Updated Vital Signs BP 132/81 (BP Location: Right Arm)   Pulse 83   Temp 98.4 F (36.9 C) (Oral)   Resp 18   Ht 5\' 5"  (1.651 m)   Wt 110.2 kg   SpO2 100%   BMI 40.44 kg/m  Physical Exam Vitals and nursing note reviewed.  Constitutional:      General: She is not in acute distress.    Appearance: Normal appearance. She is normal weight. She is not ill-appearing.  HENT:     Head: Normocephalic and atraumatic.  Pulmonary:     Effort: Pulmonary effort is normal. No respiratory distress.  Abdominal:     General: Abdomen is flat.  Musculoskeletal:        General: Normal range of motion.     Cervical back: Neck supple.     Comments: 2+ pitting edema to the RLE to the level of the mid tibia.  Dopplerable DP pulse bilaterally.  No erythema.  No warmth.  Exquisite TTP to all portions of the right knee.  Compartments are soft.  Sensation intact distally.  Capillary fill normal.  Skin:    General: Skin is warm and dry.  Neurological:     Mental Status: She is alert and oriented to person, place, and time.  Psychiatric:        Mood and Affect: Mood normal.        Behavior: Behavior normal.     ED Results / Procedures / Treatments   Labs (all labs ordered are listed, but only abnormal results are displayed) Labs Reviewed  CBC WITH DIFFERENTIAL/PLATELET -  Abnormal; Notable for the following components:      Result Value   RBC 3.76 (*)    Hemoglobin 10.3 (*)    HCT 31.5 (*)    RDW 15.6 (*)    Platelets 512 (*)    Neutro Abs 8.0 (*)    Abs Immature Granulocytes 0.09 (*)    All other components within normal limits  BODY FLUID CULTURE W GRAM STAIN  BASIC METABOLIC PANEL  GLUCOSE, BODY FLUID OTHER            PROTEIN, BODY FLUID (OTHER)  SYNOVIAL CELL COUNT + DIFF, W/ CRYSTALS    EKG None  Radiology CT EXTREMITY LOWER RIGHT W CONTRAST Result Date: 08/24/2023 CLINICAL DATA:  Right lower leg pain. Posterior knee soft tissue mass extending into the calf. EXAM: CT OF THE LOWER RIGHT EXTREMITY WITH CONTRAST TECHNIQUE: Multidetector CT imaging of the lower right extremity was performed according to the standard protocol following intravenous contrast administration. RADIATION DOSE REDUCTION: This exam was performed according to the departmental dose-optimization program which includes automated exposure control, adjustment of the mA and/or kV according to patient size and/or use of iterative reconstruction technique. CONTRAST:  OMNIPAQUE IOHEXOL 300 MG/ML  SOLN COMPARISON:  Right knee x-rays from same day. FINDINGS: Bones/Joint/Cartilage No fracture or dislocation. Degenerative changes of the right knee and ankle. Large right knee joint effusion with synovial thickening and enhancement. Moderate right Baker cyst. Ligaments Ligaments are suboptimally evaluated by CT. Muscles and Tendons Grossly intact. Moderate to severe atrophy of the posterior tibialis muscle. Soft tissue Scattered soft tissue swelling about the knee and proximal lower leg with more circumferential soft tissue swelling of the mid and distal lower leg. No fluid collection or subcutaneous emphysema. No soft tissue mass. IMPRESSION: 1. Large right knee joint effusion and moderate Baker cyst with synovitis. Correlate with arthrocentesis. 2. Right knee and lower leg soft tissue swelling,  nonspecific. No soft tissue mass or fluid collection. Electronically Signed   By: Obie Dredge M.D.   On: 08/24/2023 15:03   DG Knee Complete 4 Views Right Result Date: 08/24/2023 CLINICAL DATA:  swelling, pain x 2 weeks EXAM: RIGHT KNEE - COMPLETE 4+ VIEW COMPARISON:  None Available. FINDINGS: No acute fracture or dislocation. No aggressive osseous lesion. There are degenerative changes of the knee joint in the form of mildly reduced medial tibio-femoral compartment joint space and osteophytosis. Small to moderate suprapatellar knee joint effusion. No focal soft tissue swelling. No radiopaque foreign bodies. IMPRESSION: No acute osseous abnormality of the right knee joint. Mild degenerative changes of the right  knee joint. Electronically Signed   By: Jules Schick M.D.   On: 08/24/2023 13:11    Procedures .Joint Aspiration/Arthrocentesis  Date/Time: 08/24/2023 4:39 PM  Performed by: Michelle Piper, PA-C Authorized by: Michelle Piper, PA-C   Consent:    Consent obtained:  Verbal   Consent given by:  Patient   Risks discussed:  Bleeding, infection, incomplete drainage and pain   Alternatives discussed:  No treatment and referral Universal protocol:    Procedure explained and questions answered to patient or proxy's satisfaction: yes     Relevant documents present and verified: yes     Test results available: yes     Imaging studies available: yes     Required blood products, implants, devices, and special equipment available: yes     Site/side marked: yes     Immediately prior to procedure, a time out was called: yes     Patient identity confirmed:  Verbally with patient and arm band Location:    Location:  Knee   Knee:  R knee Anesthesia:    Anesthesia method:  Local infiltration   Local anesthetic:  Lidocaine 2% WITH epi Procedure details:    Preparation: Patient was prepped and draped in usual sterile fashion     Needle gauge:  18 G   Approach:  Lateral   Aspirate  amount:  2 cc   Aspirate characteristics:  Serous   Specimen collected: yes   Post-procedure details:    Dressing:  Adhesive bandage   Procedure completion:  Tolerated with difficulty     Medications Ordered in ED Medications  HYDROcodone-acetaminophen (NORCO/VICODIN) 5-325 MG per tablet 1 tablet (1 tablet Oral Given 08/24/23 1047)  iohexol (OMNIPAQUE) 300 MG/ML solution 100 mL (100 mLs Intravenous Contrast Given 08/24/23 1226)  lidocaine-EPINEPHrine (XYLOCAINE W/EPI) 2 %-1:100000 (with pres) injection 10 mL (10 mLs Infiltration Given 08/24/23 1554)  HYDROcodone-acetaminophen (NORCO/VICODIN) 5-325 MG per tablet 1 tablet (1 tablet Oral Given 08/24/23 1739)    ED Course/ Medical Decision Making/ A&P Clinical Course as of 08/24/23 1849  Wed Aug 24, 2023  1527 Spoke with orthopedics, Earney Hamburg PA-C.  He recommends considering arthrocentesis for symptomatic relief, still need to follow-up with orthopedics [AS]  1720 Lab believes cell count will be resulted around 8 PM [AS]    Clinical Course User Index [AS] Zariah Cavendish, Edsel Petrin, PA-C                                 Medical Decision Making Amount and/or Complexity of Data Reviewed Labs: ordered. Radiology: ordered.  Risk Prescription drug management.  This patient presents to the ED for concern of right knee pain, this involves an extensive number of treatment options, and is a complaint that carries with it a high risk of complications and morbidity.  The differential diagnosis includes ischemic limb, Baker's cyst, DVT, cellulitis, compartment syndrome, fracture, strain, sprain  My initial workup includes symptom control  Additional history obtained from: Nursing notes from this visit. Previous records within EMR system urgent care visit on 08/15/2023 with subsequent DVT study which revealed no evidence of DVT, fluid-filled structure noted posterior distal thigh into the popliteal fossa and medial knee Family at bedside EMS  provides a portion of the history  I ordered, reviewed and interpreted labs which include: CBC, BMP, synovial cell count with crystals.  No leukocytosis.  Anemia with hemoglobin 10.3 with no baseline for comparison.  No electrolyte  derangement or kidney dysfunction.   I ordered imaging studies including x-ray right knee, CT knee I independently visualized and interpreted imaging which showed large joint effusion, Baker's cyst.  Joint effusion appears to be suprapatellar I agree with the radiologist interpretation  Consultations Obtained:  I requested consultation with orthopedics, Salvadore Farber,  and discussed lab and imaging findings as well as pertinent plan - they recommend: Joint aspiration, orthopedic follow-up  Afebrile, hemodynamically stable.  68 year old female presenting to the ED for evaluation of right knee pain.  This been present for greater than 2 weeks.  Had an ultrasound approximately 8 days ago that showed large cyst, no DVT.  States she has continued pain and is unable to bear weight.  On exam, right lower extremity does have pitting edema to the level of the mid tibia.  She does have quite a bit of swelling in the right knee.  There is no overlying erythema or warmth.  Her neurovascular status is intact.  Compartments are soft.  Lab workup was reassuring.  CT of the right lower extremity was obtained to further characterize the cystic structure found on ultrasound.  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  She does also have a large effusion of the right knee, this appears to be suprapatellar.  Regarding the right lower extremity pitting edema, there is likely some compressive mass effect of the Baker's cyst causing decreased venous return.  There is no lower leg erythema or warmth to suggest cellulitis.  I had a shared decision-making conversation with the patient regarding management.  She requests arthrocentesis.  I performed an arthrocentesis.  I was able to withdraw a small amount  of synovial fluid.  This was sent for cell count and culture.  I have low suspicion for septic arthritis.  Care handed off to oncoming provider pending synovial fluid cell count.  If negative, likely safe for discharge home with orthopedic follow-up.  Short course of pain medication was sent and she was educated on potential side effects.  She was given return precautions.  Patient's case discussed with Dr. Posey Rea who agrees with plan to discharge with follow-up if cell count negative for infection.   Note: Portions of this report may have been transcribed using voice recognition software. Every effort was made to ensure accuracy; however, inadvertent computerized transcription errors may still be present.        Final Clinical Impression(s) / ED Diagnoses Final diagnoses:  Acute pain of right knee  Effusion of right knee    Rx / DC Orders ED Discharge Orders          Ordered    HYDROcodone-acetaminophen (NORCO/VICODIN) 5-325 MG tablet  Every 6 hours PRN        08/24/23 1847              Michelle Piper, PA-C 08/24/23 1849    Glendora Score, MD 08/24/23 [phone]

## 2023-08-26 LAB — BODY FLUID CULTURE W GRAM STAIN

## 2023-08-26 NOTE — ED Provider Notes (Signed)
 8:12 AM Was just called and informed that the patient's knee aspiration did grow a positive culture for strep G.  I subsequently called pharmacy and Earney Hamburg with orthopedics.  I called Trey Paula reports he will speak to orthopedics team and help handle this finding in regards to follow-up and management.   Ethelmae Ringel, Canary Brim, MD 08/26/23 [phone]

## 2023-08-27 ENCOUNTER — Telehealth (HOSPITAL_BASED_OUTPATIENT_CLINIC_OR_DEPARTMENT_OTHER): Payer: Self-pay | Admitting: *Deleted

## 2023-08-27 NOTE — Telephone Encounter (Signed)
 Post ED Visit - Positive Culture Follow-up  Culture report reviewed by antimicrobial stewardship pharmacist: [facility] []  Enzo Bi, Pharm.D. []  Celedonio Miyamoto, Pharm.D., BCPS AQ-ID []  Garvin Fila, Pharm.D., BCPS []  Georgina Pillion, Pharm.D., BCPS []  [place], [address].D., BCPS, AAHIVP []  Estella Husk, Pharm.D., BCPS, AAHIVP []  Lysle Pearl, PharmD, BCPS []  Phillips Climes, PharmD, BCPS []  Agapito Games, PharmD, BCPS []  Verlan Friends, PharmD []  Mervyn Gay, PharmD, BCPS [x]  Estill Batten, PharmD  [facility] []  Len Childs, PharmD []  Greer Pickerel, PharmD []  Adalberto Cole, PharmD []  Perlie Gold, Rph []  Lonell Face) Jean Rosenthal, PharmD []  Earl Many, PharmD []  Junita Push, PharmD []  Dorna Leitz, PharmD []  Terrilee Files, PharmD []  Lynann Beaver, PharmD []  Keturah Barre, PharmD []  Loralee Pacas, PharmD []  Bernadene Person, PharmD   Positive body fluid culture MD aware, notified pt 3/19, ortho to follow up per Dr. Rush Landmark documentation on 3/19  Debbie Diaz 08/27/2023, 11:48 AM

## 2023-11-01 ENCOUNTER — Other Ambulatory Visit: Payer: Self-pay | Admitting: Family Medicine

## 2023-11-01 ENCOUNTER — Encounter: Payer: Self-pay | Admitting: Family Medicine

## 2023-11-01 DIAGNOSIS — Z1231 Encounter for screening mammogram for malignant neoplasm of breast: Secondary | ICD-10-CM
